# Patient Record
Sex: Male | Born: 1990 | Race: Black or African American | Hispanic: No | Marital: Single | Smoking: Never smoker
Health system: Southern US, Community
[De-identification: ages and names within clinical notes are randomized; demographics above are authoritative.]

## PROBLEM LIST (undated history)

## (undated) DIAGNOSIS — M4800 Spinal stenosis, site unspecified: Secondary | ICD-10-CM

## (undated) HISTORY — PX: NECK SURGERY: SHX720

---
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
## 2013-08-01 ENCOUNTER — Encounter (HOSPITAL_COMMUNITY): Payer: Self-pay | Admitting: Emergency Medicine

## 2013-08-01 ENCOUNTER — Emergency Department (HOSPITAL_COMMUNITY)
Admission: EM | Admit: 2013-08-01 | Discharge: 2013-08-01 | Disposition: A | Discharge status: Home / Self Care | Payer: Managed Care, Other (non HMO) | Source: Home / Self Care | Attending: Emergency Medicine | Admitting: Emergency Medicine

## 2013-08-01 DIAGNOSIS — L0291 Cutaneous abscess, unspecified: Secondary | ICD-10-CM

## 2013-08-01 DIAGNOSIS — L0390 Cellulitis, unspecified: Secondary | ICD-10-CM

## 2013-08-01 HISTORY — DX: Spinal stenosis, site unspecified: M48.00

## 2013-08-01 MED ORDER — HYDROCODONE-ACETAMINOPHEN 5-325 MG PO TABS: ORAL_TABLET | ORAL | Status: DC

## 2013-08-01 MED ORDER — SULFAMETHOXAZOLE-TMP DS 800-160 MG PO TABS: 2.0000 | ORAL_TABLET | Freq: Two times a day (BID) | ORAL | Status: DC

## 2013-08-01 NOTE — Discharge Instructions (Signed)

## 2013-08-01 NOTE — ED Provider Notes (Signed)
  Chief Complaint   Chief Complaint  Patient presents with  . Abscess    History of Present Illness   Chad Li is a 23 year old male who's had a one-week history of a painful boil on his right lower back. He denies any drainage, fever, or chills. No prior history of skin infections or MRSA.  Review of Systems   Other than as noted above, the patient denies any of the following symptoms: Systemic:  No fever, chills or sweats. Skin:  No rash or itching.  PMFSH   Past medical history, family history, social history, meds, and allergies were reviewed.   Physical Examination     Vital signs:  BP 127/84  Pulse 68  Temp(Src) 98.5 F (36.9 C) (Oral)  Resp 14  SpO2 100% Skin:  There is a 2 x 2 centimeter raised, red, erythematous, tender, fluctuant mass on the right lower back.  Skin exam was otherwise normal.  No rash. Ext:  Distal pulses were full, patient has full ROM of all joints.  Procedure   Verbal informed consent was obtained.  The patient was informed of the risks and benefits of the procedure and understands and accepts.  A time out was called and the identity of the patient and correct procedure was confirmed.   The abscess area described above was prepped with Betadine and alcohol and anesthetized with 3 mL of 2% Xylocaine with epinephrine.  Using a #11 scalpel blade, a singe straight incision was made into the area of fluctulence, yielding a small amount of prurulent drainage.  Routine cultures were obtained.  Blunt dissection was used to break up loculations and the resulting wound cavity was packed with 1/4 inch Iodoform gauze.  A sterile pressure dressing was applied.  Assessment   The encounter diagnosis was Abscess.  Plan     1.  Meds:  The following meds were prescribed:   Discharge Medication List as of 08/01/2013  2:34 PM    START taking these medications   Details  HYDROcodone-acetaminophen (NORCO/VICODIN) 5-325 MG per tablet 1 to 2 tabs every 4 to  6 hours as needed for pain., Print    sulfamethoxazole-trimethoprim (BACTRIM DS) 800-160 MG per tablet Take 2 tablets by mouth 2 (two) times daily., Starting 08/01/2013, Until Discontinued, Normal        2.  Patient Education/Counseling:  The patient was given appropriate handouts, self care instructions, and instructed in symptomatic relief.    3.  Follow up:  The patient was instructed to leave the dressing in place and return here again in 48 hours for packing removal, or sooner if becoming worse in any way, and given some red flag symptoms such as fever which would prompt immediate return.       Reuben Likesavid C Raygan Skarda, MD 08/01/13 [phone]

## 2013-08-01 NOTE — ED Notes (Signed)
Abscess to right waistline, red, painful, swollen area.  Denies any history of this before

## 2013-08-03 ENCOUNTER — Encounter (HOSPITAL_COMMUNITY): Payer: Self-pay | Admitting: Emergency Medicine

## 2013-08-03 ENCOUNTER — Emergency Department (INDEPENDENT_AMBULATORY_CARE_PROVIDER_SITE_OTHER)
Admission: EM | Admit: 2013-08-03 | Discharge: 2013-08-03 | Disposition: A | Discharge status: Home / Self Care | Payer: Managed Care, Other (non HMO) | Source: Home / Self Care

## 2013-08-03 DIAGNOSIS — Z5189 Encounter for other specified aftercare: Secondary | ICD-10-CM

## 2013-08-03 DIAGNOSIS — Z4800 Encounter for change or removal of nonsurgical wound dressing: Secondary | ICD-10-CM

## 2013-08-03 NOTE — ED Provider Notes (Signed)
Medical screening examination/treatment/procedure(s) were performed by non-physician practitioner and as supervising physician I was immediately available for consultation/collaboration.  Leslee Homeavid Sanjuana Mruk, M.D.  Reuben Likesavid C Jameon Deller, MD 08/03/13 Mikle [id number]

## 2013-08-03 NOTE — ED Provider Notes (Signed)
CSN: [id number]     Arrival date & time 08/03/13  1002 History   First MD Initiated Contact with Patient 08/03/13 1108     Chief Complaint  Patient presents with  . Wound Check   (Consider location/radiation/quality/duration/timing/severity/associated sxs/prior Treatment) HPI Comments: As above, to remove packing. There is a rounded area of induration with approx 5 ml of purulence expressed. Remains tender. St is taking his ABX. Culture report no growth on first report.  Patient is a 23 y.o. male presenting with wound check.  Wound Check    Past Medical History  Diagnosis Date  . Spinal stenosis    Past Surgical History  Procedure Laterality Date  . Neck surgery     History reviewed. No pertinent family history. History  Substance Use Topics  . Smoking status: Never Smoker   . Smokeless tobacco: Not on file  . Alcohol Use: No    Review of Systems  Constitutional: Negative for fever and chills.  Skin:       As per HPI  All other systems reviewed and are negative.    Allergies  Review of patient's allergies indicates no known allergies.  Home Medications   Current Outpatient Rx  Name  Route  Sig  Dispense  Refill  . HYDROcodone-acetaminophen (NORCO/VICODIN) 5-325 MG per tablet      1 to 2 tabs every 4 to 6 hours as needed for pain.   20 tablet   0   . sulfamethoxazole-trimethoprim (BACTRIM DS) 800-160 MG per tablet   Oral   Take 2 tablets by mouth 2 (two) times daily.   40 tablet   0    BP 113/76  Pulse 60  Temp(Src) 97.9 F (36.6 C) (Oral)  Resp 14  SpO2 100% Physical Exam  ED Course  Procedures (including critical care time) Labs Review Labs Reviewed - No data to display Imaging Review No results found.   MDM   1. Wound check, abscess    Packing removed There was about 5 ml of pus expressed. There remains an induration surrounding the incision site but no unexpected redness or lymphangitis.  Cont warm compresses and all of ABX. Return  if worse.    Hayden Rasmussenavid Kilo Eshelman, NP 08/03/13 [phone]

## 2013-08-03 NOTE — Discharge Instructions (Signed)
Continue the antibiotics and use warm compresses frequently. May shower to clean wound. If getting worse, larger, painful ,red. Return promptly.

## 2013-08-03 NOTE — ED Notes (Signed)
Packing removal, seen 3/31 for i/d.  Patient reports feeling better.  Current pain is a 4/10 with a burning sensation.  Overall , pain is much better

## 2013-08-04 LAB — CULTURE, ROUTINE-ABSCESS
Gram Stain: NONE SEEN
SPECIAL REQUESTS: NORMAL

## 2013-08-04 NOTE — Progress Notes (Signed)
Quick Note:  Results are abnormal as noted, but have been adequately treated. No further action necessary. ______ 

## 2013-08-05 ENCOUNTER — Telehealth (HOSPITAL_COMMUNITY): Payer: Self-pay | Admitting: *Deleted

## 2013-08-05 NOTE — ED Notes (Signed)
Abscess culture back: Abundant MRSA.  Pt. adequately treated with Bactrim DS.  I called pt. Pt. verified x 2 and given results.  Pt. told he was adequately treated.  [facility] the [facility] MRSA instructions with him and answered his questions. Pt. voiced understanding. Vassie MoselleYork, Eian Vandervelden M 08/05/2013

## 2013-10-03 ENCOUNTER — Emergency Department (HOSPITAL_COMMUNITY)
Admission: EM | Admit: 2013-10-03 | Discharge: 2013-10-03 | Disposition: A | Discharge status: Home / Self Care | Payer: Managed Care, Other (non HMO) | Source: Home / Self Care | Attending: Family Medicine | Admitting: Family Medicine

## 2013-10-03 ENCOUNTER — Encounter (HOSPITAL_COMMUNITY): Payer: Self-pay | Admitting: Emergency Medicine

## 2013-10-03 DIAGNOSIS — H6123 Impacted cerumen, bilateral: Secondary | ICD-10-CM

## 2013-10-03 DIAGNOSIS — H6120 Impacted cerumen, unspecified ear: Secondary | ICD-10-CM

## 2013-10-03 NOTE — ED Notes (Signed)
Pt  Reports  A  Sensation of  Fullness  To l  Ear      -  He  Has    Been  Using  q  Tips  At  Home          -  He      Is  Sitting  Upright on the  Exam table  Speaking in complete  sentances  And  Is  In no    Severe  Distress

## 2013-10-03 NOTE — ED Provider Notes (Signed)
CSN: [id number]     Arrival date & time 10/03/13  1426 History   First MD Initiated Contact with Patient 10/03/13 1522     Chief Complaint  Patient presents with  . Cerumen Impaction   (Consider location/radiation/quality/duration/timing/severity/associated sxs/prior Treatment) Patient is a 23 y.o. male presenting with plugged ear sensation. The history is provided by the patient.  Ear Fullness This is a new problem. The current episode started yesterday. The problem has been gradually worsening. Associated symptoms comments: Using q-tips and now can't hear..    Past Medical History  Diagnosis Date  . Spinal stenosis    Past Surgical History  Procedure Laterality Date  . Neck surgery     History reviewed. No pertinent family history. History  Substance Use Topics  . Smoking status: Never Smoker   . Smokeless tobacco: Not on file  . Alcohol Use: No    Review of Systems  Constitutional: Negative.   HENT: Positive for hearing loss. Negative for ear discharge and ear pain.     Allergies  Review of patient's allergies indicates no known allergies.  Home Medications   Prior to Admission medications   Medication Sig Start Date End Date Taking? Authorizing Provider  HYDROcodone-acetaminophen (NORCO/VICODIN) 5-325 MG per tablet 1 to 2 tabs every 4 to 6 hours as needed for pain. 08/01/13   Reuben Likes, MD  sulfamethoxazole-trimethoprim (BACTRIM DS) 800-160 MG per tablet Take 2 tablets by mouth 2 (two) times daily. 08/01/13   Reuben Likes, MD   BP 113/65  Pulse 56  Temp(Src) 97.4 F (36.3 C) (Oral)  Resp 12  SpO2 100% Physical Exam  Nursing note and vitals reviewed. Constitutional: He is oriented to person, place, and time. He appears well-developed and well-nourished.  HENT:  Cerumen impaction bilat.  Neck: Normal range of motion. Neck supple.  Lymphadenopathy:    He has no cervical adenopathy.  Neurological: He is alert and oriented to person, place, and time.   Skin: Skin is warm and dry.    ED Course  EAR CERUMEN REMOVAL Date/Time: 10/03/2013 3:29 PM Performed by: Linna Hoff Authorized by: Bradd Canary D Consent: Verbal consent obtained. Risks and benefits: risks, benefits and alternatives were discussed Consent given by: patient Local anesthetic: none Location details: right ear Procedure type: irrigation Patient sedated: no Patient tolerance: Patient tolerated the procedure well with no immediate complications.   (including critical care time) Labs Review Labs Reviewed - No data to display  Imaging Review No results found.   MDM   1. Impacted cerumen of both ears    Canals and tm nl bilat after irrig.    Linna Hoff, MD 10/05/13 [phone]

## 2013-12-22 ENCOUNTER — Emergency Department (INDEPENDENT_AMBULATORY_CARE_PROVIDER_SITE_OTHER)
Admission: EM | Admit: 2013-12-22 | Discharge: 2013-12-22 | Disposition: A | Discharge status: Home / Self Care | Payer: Managed Care, Other (non HMO) | Source: Home / Self Care | Attending: Family Medicine | Admitting: Family Medicine

## 2013-12-22 ENCOUNTER — Encounter (HOSPITAL_COMMUNITY): Payer: Self-pay | Admitting: Emergency Medicine

## 2013-12-22 DIAGNOSIS — B2790 Infectious mononucleosis, unspecified without complication: Secondary | ICD-10-CM

## 2013-12-22 LAB — POCT RAPID STREP A: STREPTOCOCCUS, GROUP A SCREEN (DIRECT): NEGATIVE

## 2013-12-22 LAB — POCT INFECTIOUS MONO SCREEN: Mono Screen: POSITIVE — AB

## 2013-12-22 NOTE — ED Notes (Signed)
C/o sore throat-8/17

## 2013-12-22 NOTE — Discharge Instructions (Signed)
Thank you for coming in today. Take up to 2 aleve twice daily.  Come back as needed   Infectious Mononucleosis Infectious mononucleosis (mono) is a common germ (viral) infection in children, teenagers, and young adults.  CAUSES  Mono is an infection caused by the Malachi CarlEpstein Barr virus. The virus is spread by close personal contact with someone who has the infection. It can be passed by contact with your saliva through things such as kissing or sharing drinking glasses. Sometimes, the infection can be spread from someone who does not appear sick but still spreads the virus (asymptomatic carrier state).  SYMPTOMS  The most common symptoms of Mono are:  Sore throat.  Headache.  Fatigue.  Muscle aches.  Swollen glands.  Fever.  Poor appetite.  Enlarged liver or spleen. The less common symptoms can include:  Rash.  Feeling sick to your stomach (nauseous).  Abdominal pain. DIAGNOSIS  Mono is diagnosed by a blood test.  TREATMENT  Treatment of mono is usually at home. There is no medicine that cures this virus. Sometimes hospital treatment is needed in severe cases. Steroid medicine sometimes is needed if the swelling in the throat causes breathing or swallowing problems.  HOME CARE INSTRUCTIONS   Drink enough fluids to keep your urine clear or pale yellow.  Eat soft foods. Cool foods like popsicles or ice cream can soothe a sore throat.  Only take over-the-counter or prescription medicines for pain, discomfort, or fever as directed by your caregiver. Children under 21 years of age should not take aspirin.  Gargle salt water. This may help relieve your sore throat. Put 1 teaspoon (tsp) of salt in 1 cup of warm water. Sucking on hard candy may also help.  Rest as needed.  Start regular activities gradually after the fever is gone. Be sure to rest when tired.  Avoid strenuous exercise or contact sports until your caregiver says it is okay. The liver and spleen could be  seriously injured.  Avoid sharing drinking glasses or kissing until your caregiver tells you that you are no longer contagious. SEEK MEDICAL CARE IF:   Your fever is not gone after 7 days.  Your activity level is not back to normal after 2 weeks.  You have yellow coloring to eyes and skin (jaundice). SEEK IMMEDIATE MEDICAL CARE IF:   You have severe pain in the abdomen or shoulder.  You have trouble swallowing or drooling.  You have trouble breathing.  You develop a stiff neck.  You develop a severe headache.  You cannot stop throwing up (vomiting).  You have convulsions.  You are confused.  You have trouble with balance.  You develop signs of body fluid loss (dehydration):  Weakness.  Sunken eyes.  Pale skin.  Dry mouth.  Rapid breathing or pulse. MAKE SURE YOU:   Understand these instructions.  Will watch your condition.  Will get help right away if you are not doing well or get worse. Document Released: 04/17/2000 Document Revised: 07/13/2011 Document Reviewed: 02/14/2008 [facility] Patient Information 2015 [place], [place]. This information is not intended to replace advice given to you by your health care provider. Make sure you discuss any questions you have with your health care provider.

## 2013-12-22 NOTE — ED Provider Notes (Signed)
Chad Li is a 23 y.o. male who presents to Urgent Care today for sore throat. Patient has a 2 to three-day history of sore throat. His wife and recently tested positive for mono. Fevers or chills nausea vomiting or diarrhea. Pain is mild. Patient has not tried any medications yet.   Past Medical History  Diagnosis Date  . Spinal stenosis    History  Substance Use Topics  . Smoking status: Never Smoker   . Smokeless tobacco: Not on file  . Alcohol Use: No   ROS as above Medications: No current facility-administered medications for this encounter.   Current Outpatient Prescriptions  Medication Sig Dispense Refill  . AMOXICILLIN PO Take by mouth.        Exam:  BP 131/59  Pulse 61  Temp(Src) 98.8 F (37.1 C) (Oral)  Resp 12  SpO2 100% Gen: Well NAD HEENT: EOMI,  MMM posterior pharynx is erythematous with exudate. Normal tympanic membranes bilaterally Lungs: Normal work of breathing. CTABL Heart: RRR no MRG Abd: NABS, Soft. Nondistended, Nontender Exts: Brisk capillary refill, warm and well perfused.   Results for orders placed during the hospital encounter of 12/22/13 (from the past 24 hour(Li))  POCT RAPID STREP A ([facility] URG CARE ONLY)     Status: None   Collection Time    12/22/13  7:36 PM      Result Value Ref Range   Streptococcus, Group A Screen (Direct) NEGATIVE  NEGATIVE  POCT INFECTIOUS MONO SCREEN     Status: Abnormal   Collection Time    12/22/13  7:44 PM      Result Value Ref Range   Mono Screen POSITIVE (*) NEGATIVE   No results found.  Assessment and Plan: 23 y.o. male with Mono. The Monospot test was somewhat equivocal. Most likely explanation. Her culture pending. Symptomatic measures with Tylenol or ibuprofen  Discussed warning signs or symptoms. Please see discharge instructions. Patient expresses understanding.   This note was created using Conservation officer, historic buildingsDragon voice recognition software. Any transcription errors are unintended.    Chad BongEvan Li Chad Bentley,  MD 12/22/13 2120

## 2013-12-24 LAB — CULTURE, GROUP A STREP

## 2014-02-15 ENCOUNTER — Ambulatory Visit (INDEPENDENT_AMBULATORY_CARE_PROVIDER_SITE_OTHER): Payer: 59 | Admitting: Family Medicine

## 2014-02-15 VITALS — BP 118/66 | HR 72 | Temp 98.8°F | Resp 16 | Ht 72.52 in | Wt 133.6 lb

## 2014-02-15 DIAGNOSIS — Z8614 Personal history of Methicillin resistant Staphylococcus aureus infection: Secondary | ICD-10-CM

## 2014-02-15 DIAGNOSIS — L02214 Cutaneous abscess of groin: Secondary | ICD-10-CM

## 2014-02-15 MED ORDER — SULFAMETHOXAZOLE-TMP DS 800-160 MG PO TABS: 1.0000 | ORAL_TABLET | Freq: Two times a day (BID) | ORAL | Status: DC

## 2014-02-15 NOTE — Patient Instructions (Signed)
Warm compresses at least 4-5 times per day, take antibiotic twice per day.  Avoid close shaving of this area in future. If bumps in folds of leg not improving once the skin infection improves - return for other testing and evaluation.   Abscess An abscess is an infected area that contains a collection of pus and debris.It can occur in almost any part of the body. An abscess is also known as a furuncle or boil. CAUSES  An abscess occurs when tissue gets infected. This can occur from blockage of oil or sweat glands, infection of hair follicles, or a minor injury to the skin. As the body tries to fight the infection, pus collects in the area and creates pressure under the skin. This pressure causes pain. People with weakened immune systems have difficulty fighting infections and get certain abscesses more often.  SYMPTOMS Usually an abscess develops on the skin and becomes a painful mass that is red, warm, and tender. If the abscess forms under the skin, you may feel a moveable soft area under the skin. Some abscesses break open (rupture) on their own, but most will continue to get worse without care. The infection can spread deeper into the body and eventually into the bloodstream, causing you to feel ill.  DIAGNOSIS  Your caregiver will take your medical history and perform a physical exam. A sample of fluid may also be taken from the abscess to determine what is causing your infection. TREATMENT  Your caregiver may prescribe antibiotic medicines to fight the infection. However, taking antibiotics alone usually does not cure an abscess. Your caregiver may need to make a small cut (incision) in the abscess to drain the pus. In some cases, gauze is packed into the abscess to reduce pain and to continue draining the area. HOME CARE INSTRUCTIONS   Only take over-the-counter or prescription medicines for pain, discomfort, or fever as directed by your caregiver.  If you were prescribed antibiotics, take  them as directed. Finish them even if you start to feel better.  If gauze is used, follow your caregiver's directions for changing the gauze.  To avoid spreading the infection:  Keep your draining abscess covered with a bandage.  Wash your hands well.  Do not share personal care items, towels, or whirlpools with others.  Avoid skin contact with others.  Keep your skin and clothes clean around the abscess.  Keep all follow-up appointments as directed by your caregiver. SEEK MEDICAL CARE IF:   You have increased pain, swelling, redness, fluid drainage, or bleeding.  You have muscle aches, chills, or a general ill feeling.  You have a fever. MAKE SURE YOU:   Understand these instructions.  Will watch your condition.  Will get help right away if you are not doing well or get worse. Document Released: 01/28/2005 Document Revised: 10/20/2011 Document Reviewed: 07/03/2011 [facility] Patient Information 2015 [place], [place]. This information is not intended to replace advice given to you by your health care provider. Make sure you discuss any questions you have with your health care provider.

## 2014-02-15 NOTE — Progress Notes (Signed)
Procedure Note Consent obtained. Cleaned abscess with alcohol. Local anesthesia 1% lido w/ epi. Cleaned with iodine. 1 in incision made along abscess. Culture obtained. Pus expressed. Wound explored and packed. Dressing placed.   Janan Ridgeishira Nana Hoselton, PA-C

## 2014-02-15 NOTE — Progress Notes (Signed)
Subjective:  This chart was scribed for Chad StaggersJeffrey Aleksa Catterton, MD by Chad Li, Medical Scribe. This patient was seen in Room 11 and the patient's care was started at 4:07 PM.   Patient ID: Chad Li, male    DOB: 06-Oct-1990, 23 y.o.   MRN: [id number]  HPI HPI Comments: Chad Li is a 23 y.o. male who presents to the [facility] complaining of a gradually worsening abscess in the right suprapubic are of his abdomen for 1 week. Pt noticed it after shaving and thought initially it was a little hair bump. He has noticed some drainage from the abscess that began yesterday.  Has not taken any medication. He has applied hot compresses intermittently 2x a day for 2-3 days. He denies any new sexual partners. He has had around 10 lifetime sexual partners. He was last treated for an STI 1 year ago. He has not had any new sexual partners since the last test. He denies any fever, chills, nausea, vomiting, diarrhea, penile discharge, testicular pain.   On chart review he was treated in March 2015 with a right lower back abscess with Septra DS. Wound culture positive for MRSA at that time sensitive to Septra and tetracycline  PCP: No PCP Per Patient  There are no active problems to display for this patient.  Past Medical History  Diagnosis Date  . Spinal stenosis    Past Surgical History  Procedure Laterality Date  . Neck surgery     No Known Allergies Prior to Admission medications   Not on File   History   Social History  . Marital Status: Single    Spouse Name: N/A    Number of Children: N/A  . Years of Education: N/A   Occupational History  . Not on file.   Social History Main Topics  . Smoking status: Never Smoker   . Smokeless tobacco: Not on file  . Alcohol Use: No  . Drug Use: No  . Sexual Activity: Not on file   Other Topics Concern  . Not on file   Social History Narrative  . No narrative on file     Review of Systems  Constitutional:  Negative for fever and chills.  Gastrointestinal: Negative for nausea, vomiting and diarrhea.  Genitourinary: Negative for discharge, penile pain and testicular pain.  Skin: Positive for color change (around abscess).       + Abscess to right suprapubic area of abdomen  All other systems reviewed and are negative.      Objective:   Physical Exam  Nursing note and vitals reviewed. Constitutional: He is oriented to person, place, and time. He appears well-developed and well-nourished. No distress.  HENT:  Head: Normocephalic and atraumatic.  Eyes: Conjunctivae and EOM are normal.  Neck: Neck supple. No tracheal deviation present.  Cardiovascular: Normal rate.   Pulmonary/Chest: Effort normal. No respiratory distress.  Genitourinary: Testes normal and penis normal. Right testis shows no tenderness. Left testis shows no tenderness. No discharge found.  Musculoskeletal: Normal range of motion.  Lymphadenopathy:       Right: Inguinal (non tender) adenopathy present.       Left: Inguinal (non tender) adenopathy present.  Neurological: He is alert and oriented to person, place, and time.  Skin: Skin is warm and dry.  6 cm by 3 cm of erythema on the right lower abdominal wall just above inguinal crease with central induration and fluctuance over middle most aspect with overlying pustule. He  does have some enlarged right and left inguinal lymph nodes   Psychiatric: He has a normal mood and affect. His behavior is normal.      Filed Vitals:   02/15/14 1546  BP: 118/66  Pulse: 72  Temp: 98.8 F (37.1 C)  TempSrc: Oral  Resp: 16  Height: 6' 0.52" (1.842 m)  Weight: 133 lb 9.6 oz (60.601 kg)  SpO2: 100%       Assessment & Plan:   Chad Li is a 23 y.o. male Abscess of right groin - Plan: Wound culture, sulfamethoxazole-trimethoprim (BACTRIM DS) 800-160 MG per tablet  History of MRSA infection - Plan: Wound culture, sulfamethoxazole-trimethoprim (BACTRIM DS) 800-160 MG per  tablet  I and D as in other note. Start Septra as tolerated prior. rtc precautions and aftercare discussed. Suspected reactive LAD in groin, but RTC if not resolving with treatment of abscess. All questions answered.   Meds ordered this encounter  Medications  . sulfamethoxazole-trimethoprim (BACTRIM DS) 800-160 MG per tablet    Sig: Take 1 tablet by mouth 2 (two) times daily.    Dispense:  20 tablet    Refill:  0   Patient Instructions  Warm compresses at least 4-5 times per day, take antibiotic twice per day.  Avoid close shaving of this area in future. If bumps in folds of leg not improving once the skin infection improves - return for other testing and evaluation.   Abscess An abscess is an infected area that contains a collection of pus and debris.It can occur in almost any part of the body. An abscess is also known as a furuncle or boil. CAUSES  An abscess occurs when tissue gets infected. This can occur from blockage of oil or sweat glands, infection of hair follicles, or a minor injury to the skin. As the body tries to fight the infection, pus collects in the area and creates pressure under the skin. This pressure causes pain. People with weakened immune systems have difficulty fighting infections and get certain abscesses more often.  SYMPTOMS Usually an abscess develops on the skin and becomes a painful mass that is red, warm, and tender. If the abscess forms under the skin, you may feel a moveable soft area under the skin. Some abscesses break open (rupture) on their own, but most will continue to get worse without care. The infection can spread deeper into the body and eventually into the bloodstream, causing you to feel ill.  DIAGNOSIS  Your caregiver will take your medical history and perform a physical exam. A sample of fluid may also be taken from the abscess to determine what is causing your infection. TREATMENT  Your caregiver may prescribe antibiotic medicines to fight the  infection. However, taking antibiotics alone usually does not cure an abscess. Your caregiver may need to make a small cut (incision) in the abscess to drain the pus. In some cases, gauze is packed into the abscess to reduce pain and to continue draining the area. HOME CARE INSTRUCTIONS   Only take over-the-counter or prescription medicines for pain, discomfort, or fever as directed by your caregiver.  If you were prescribed antibiotics, take them as directed. Finish them even if you start to feel better.  If gauze is used, follow your caregiver's directions for changing the gauze.  To avoid spreading the infection:  Keep your draining abscess covered with a bandage.  Wash your hands well.  Do not share personal care items, towels, or whirlpools with others.  Avoid  skin contact with others.  Keep your skin and clothes clean around the abscess.  Keep all follow-up appointments as directed by your caregiver. SEEK MEDICAL CARE IF:   You have increased pain, swelling, redness, fluid drainage, or bleeding.  You have muscle aches, chills, or a general ill feeling.  You have a fever. MAKE SURE YOU:   Understand these instructions.  Will watch your condition.  Will get help right away if you are not doing well or get worse. Document Released: 01/28/2005 Document Revised: 10/20/2011 Document Reviewed: 07/03/2011 [facility] Patient Information 2015 [place], [place]. This information is not intended to replace advice given to you by your health care provider. Make sure you discuss any questions you have with your health care provider.

## 2014-02-18 LAB — WOUND CULTURE: Gram Stain: NONE SEEN

## 2014-02-19 ENCOUNTER — Ambulatory Visit (INDEPENDENT_AMBULATORY_CARE_PROVIDER_SITE_OTHER): Payer: 59 | Admitting: Physician Assistant

## 2014-02-19 VITALS — BP 120/76 | HR 64 | Temp 98.4°F | Resp 16 | Ht 72.5 in | Wt 135.6 lb

## 2014-02-19 DIAGNOSIS — Z5189 Encounter for other specified aftercare: Secondary | ICD-10-CM

## 2014-02-19 DIAGNOSIS — L02214 Cutaneous abscess of groin: Secondary | ICD-10-CM

## 2014-02-19 NOTE — Patient Instructions (Signed)
The incision is looking much better.  Continue to take your antibiotics as prescribed. The cultures from last visit came back and the antibiotic works great for that.  Continue to apply the warm compresses a few times per day.  We did not need to repack the wound today since it appears to be improving, is not draining anything, and is not causing you any pain. If you notice it starts to get bigger, cause you pain, or discharge starts to come out of it again, please come back to be seen.

## 2014-02-19 NOTE — Progress Notes (Signed)
I have discussed this patient with Mr. Agustin CreeMcVeigh, Cordelia Poche-C and agree.

## 2014-02-19 NOTE — Progress Notes (Signed)
   Subjective:    Patient ID: Chad Li, male    DOB: [birth year], 23 y.o.   MRN: [id number]  No PCP Per Patient  Chief Complaint  Patient presents with  . Wound Check   There are no active problems to display for this patient.  Prior to Admission medications   Medication Sig Start Date End Date Taking? Authorizing Provider  sulfamethoxazole-trimethoprim (BACTRIM DS) 800-160 MG per tablet Take 1 tablet by mouth 2 (two) times daily. 02/15/14  Yes Shade FloodJeffrey R Greene, MD   Medications, allergies, past medical history, surgical history, family history, social history and problem list reviewed and updated.   Wound Check   23 yom returns for wound care visit after having right groin/lower abdomen abscess I&D on 02/15/14. Cultures came back positive for MRSA. He has been taking his bactrim as prescribed. Been applying warm compresses once daily. When he was here initially on 10/15 the wound was packed. Unfortunately the packing fell out the next day on 10/16 when he was changing his dressing.  He has not been having any more pain or discomfort at the site for the past couple days. He has not seen any drainage for the past day with his dressing changes.   Review of Systems No fever, chills, N/V, diarrhea, no penile discharge.     Objective:   Physical Exam  Constitutional: He is oriented to person, place, and time.  BP 120/76  Pulse 64  Temp(Src) 98.4 F (36.9 C) (Oral)  Resp 16  Ht 6' 0.5" (1.842 m)  Wt 135 lb 9.6 oz (61.508 kg)  BMI 18.13 kg/m2  SpO2 99%   Lymphadenopathy:       Right: No inguinal adenopathy present.       Left: No inguinal adenopathy present.  Neurological: He is alert and oriented to person, place, and time.  Skin:  4cm x 2cm erythematous area right lower abdomen, just above inguinal canal. Entire 4cm x 2cm area is indurated. No fluctuance. 0.5" incision at center of area is present, the tissue edges have reconnected and there is no active drainage with  expression. Small amount of serous drainage on dressing upon removal.       Assessment & Plan:   23 yom with history of prior MRSA abscess returns for wound care.   Abscess of right groin  Encounter for wound care  --Wound is healing appropriately. Erythema, induration, and drainage have all improved. --No packing placed today as wound has closed up since packing fell out 3 days ago.  --Culture came back as MRSA susceptible to the bactrim he is on, continue ABI. --Pt encouraged to RTC if area becomes painful, seems to worsen, or if drainage starts to come out of incision again.   Donnajean Lopesodd M. Zylon Creamer, PA-C Physician Assistant-Certified [facility]  02/19/2014 2:00 PM

## 2016-04-01 ENCOUNTER — Ambulatory Visit (HOSPITAL_COMMUNITY)
Admission: EM | Admit: 2016-04-01 | Discharge: 2016-04-01 | Disposition: A | Discharge status: Home / Self Care | Payer: Self-pay | Attending: Internal Medicine | Admitting: Internal Medicine

## 2016-04-01 ENCOUNTER — Encounter (HOSPITAL_COMMUNITY): Payer: Self-pay | Admitting: Emergency Medicine

## 2016-04-01 DIAGNOSIS — R300 Dysuria: Secondary | ICD-10-CM | POA: Insufficient documentation

## 2016-04-01 DIAGNOSIS — N342 Other urethritis: Secondary | ICD-10-CM | POA: Insufficient documentation

## 2016-04-01 DIAGNOSIS — Z79899 Other long term (current) drug therapy: Secondary | ICD-10-CM | POA: Insufficient documentation

## 2016-04-01 DIAGNOSIS — Z9889 Other specified postprocedural states: Secondary | ICD-10-CM | POA: Insufficient documentation

## 2016-04-01 DIAGNOSIS — M4800 Spinal stenosis, site unspecified: Secondary | ICD-10-CM | POA: Insufficient documentation

## 2016-04-01 LAB — POCT URINALYSIS DIP (DEVICE)
Bilirubin Urine: NEGATIVE
GLUCOSE, UA: NEGATIVE mg/dL
Hgb urine dipstick: NEGATIVE
KETONES UR: NEGATIVE mg/dL
LEUKOCYTES UA: NEGATIVE
Nitrite: NEGATIVE
Protein, ur: 30 mg/dL — AB
UROBILINOGEN UA: 1 mg/dL (ref 0.0–1.0)
pH: 7 (ref 5.0–8.0)

## 2016-04-01 MED ORDER — STERILE WATER FOR INJECTION IJ SOLN
INTRAMUSCULAR | Status: AC
  Filled 2016-04-01: qty 10

## 2016-04-01 MED ORDER — AZITHROMYCIN 250 MG PO TABS
1000.0000 mg | ORAL_TABLET | Freq: Once | ORAL | Status: AC
  Administered 2016-04-01: 1000 mg via ORAL

## 2016-04-01 MED ORDER — AZITHROMYCIN 250 MG PO TABS
ORAL_TABLET | ORAL | Status: AC
  Filled 2016-04-01: qty 4

## 2016-04-01 MED ORDER — CEFTRIAXONE SODIUM 250 MG IJ SOLR
250.0000 mg | Freq: Once | INTRAMUSCULAR | Status: AC
  Administered 2016-04-01: 250 mg via INTRAMUSCULAR

## 2016-04-01 MED ORDER — CEFTRIAXONE SODIUM 250 MG IJ SOLR
INTRAMUSCULAR | Status: AC
  Filled 2016-04-01: qty 250

## 2016-04-01 MED ORDER — PHENAZOPYRIDINE HCL 200 MG PO TABS: 200.0000 mg | ORAL_TABLET | Freq: Three times a day (TID) | ORAL | 0 refills | Status: DC

## 2016-04-01 NOTE — ED Triage Notes (Signed)
[facility] a [facility] dysuria x 4 days. The patient denied any abdominal or back pain or penile discharge.

## 2016-04-01 NOTE — ED Provider Notes (Signed)
CSN: [id number]     Arrival date & time 04/01/16  1121 History   First MD Initiated Contact with Patient 04/01/16 1312     Chief Complaint  Patient presents with  . Dysuria   (Consider location/radiation/quality/duration/timing/severity/associated sxs/prior Treatment) Patient has been having some dysuria and penile discomfort for 4 days.  He has had unprotected sex.   The history is provided by the patient.  Dysuria  This is a new problem. The current episode started more than 2 days ago. The problem occurs constantly. The problem has not changed since onset.The symptoms are aggravated by intercourse. Nothing relieves the symptoms. He has tried nothing for the symptoms.    Past Medical History:  Diagnosis Date  . Spinal stenosis    Past Surgical History:  Procedure Laterality Date  . NECK SURGERY     History reviewed. No pertinent family history. Social History  Substance Use Topics  . Smoking status: Never Smoker  . Smokeless tobacco: Not on file  . Alcohol use No    Review of Systems  Constitutional: Negative.   HENT: Negative.   Eyes: Negative.   Respiratory: Negative.   Cardiovascular: Negative.   Gastrointestinal: Negative.   Endocrine: Negative.   Genitourinary: Positive for dysuria.  Musculoskeletal: Negative.   Skin: Negative.   Allergic/Immunologic: Negative.   Neurological: Negative.   Hematological: Negative.   Psychiatric/Behavioral: Negative.     Allergies  Patient has no known allergies.  Home Medications   Prior to Admission medications   Medication Sig Start Date End Date Taking? Authorizing Provider  sulfamethoxazole-trimethoprim (BACTRIM DS) 800-160 MG per tablet Take 1 tablet by mouth 2 (two) times daily. 02/15/14   Shade FloodJeffrey R Greene, MD   Meds Ordered and Administered this Visit  Medications - No data to display  BP (!) 124/54 (BP Location: Left Arm)   Pulse 64   Temp 98.6 F (37 C) (Oral)   Resp 16   SpO2 99%  No data  found.   Physical Exam  Constitutional: He is oriented to person, place, and time. He appears well-developed and well-nourished.  HENT:  Head: Normocephalic and atraumatic.  Eyes: Conjunctivae and EOM are normal. Pupils are equal, round, and reactive to light.  Neck: Normal range of motion. Neck supple.  Cardiovascular: Normal rate, regular rhythm and normal heart sounds.   Pulmonary/Chest: Effort normal and breath sounds normal.  Genitourinary: Penile tenderness present.  Genitourinary Comments: No urethral Discharge.  Urethral tenderness. No testicular tenderness. No inguinal hernia bilateral.  Neurological: He is alert and oriented to person, place, and time.  Nursing note and vitals reviewed.   [facility]gent [facility] Course   Clinical Course     Procedures (including critical care time)  Labs Review Labs Reviewed  POCT URINALYSIS DIP (DEVICE) - Abnormal; Notable for the following:       Result Value   Protein, ur 30 (*)    All other components within normal limits    Imaging Review No results found.   Visual Acuity Review  Right Eye Distance:   Left Eye Distance:   Bilateral Distance:    Right Eye Near:   Left Eye Near:    Bilateral Near:         MDM   Urethritis - Rocephin 250mg  IM Zithromax 250mg  x 4  Pyridium 200mg  one po tid x 3 days #9 UA wnl UA cytology GC chlamydia, trich     Deatra CanterWilliam J Oxford, FNP 04/01/16 [phone]

## 2016-04-01 NOTE — ED Notes (Signed)
Dirty and Clean urine collected.

## 2016-04-02 LAB — URINE CYTOLOGY ANCILLARY ONLY
Chlamydia: NEGATIVE
Neisseria Gonorrhea: NEGATIVE
Trichomonas: NEGATIVE

## 2017-05-06 ENCOUNTER — Other Ambulatory Visit: Payer: Self-pay

## 2017-05-06 ENCOUNTER — Emergency Department (HOSPITAL_BASED_OUTPATIENT_CLINIC_OR_DEPARTMENT_OTHER)
Admission: EM | Admit: 2017-05-06 | Discharge: 2017-05-06 | Disposition: A | Discharge status: Home / Self Care | Payer: BLUE CROSS/BLUE SHIELD | Attending: Emergency Medicine | Admitting: Emergency Medicine

## 2017-05-06 ENCOUNTER — Encounter (HOSPITAL_BASED_OUTPATIENT_CLINIC_OR_DEPARTMENT_OTHER): Payer: Self-pay

## 2017-05-06 ENCOUNTER — Emergency Department (HOSPITAL_BASED_OUTPATIENT_CLINIC_OR_DEPARTMENT_OTHER): Payer: BLUE CROSS/BLUE SHIELD

## 2017-05-06 DIAGNOSIS — N50811 Right testicular pain: Secondary | ICD-10-CM | POA: Insufficient documentation

## 2017-05-06 DIAGNOSIS — N50819 Testicular pain, unspecified: Secondary | ICD-10-CM

## 2017-05-06 DIAGNOSIS — N503 Cyst of epididymis: Secondary | ICD-10-CM | POA: Insufficient documentation

## 2017-05-06 LAB — URINALYSIS, ROUTINE W REFLEX MICROSCOPIC
Bilirubin Urine: NEGATIVE
Glucose, UA: NEGATIVE mg/dL
Hgb urine dipstick: NEGATIVE
Ketones, ur: NEGATIVE mg/dL
LEUKOCYTES UA: NEGATIVE
Nitrite: NEGATIVE
PROTEIN: NEGATIVE mg/dL
Specific Gravity, Urine: 1.02 (ref 1.005–1.030)
pH: 8 (ref 5.0–8.0)

## 2017-05-06 NOTE — ED Triage Notes (Signed)
C/o pain to right testicle x today-denies injury-NAD-steady gait

## 2017-05-06 NOTE — ED Provider Notes (Signed)
[facility] EMERGENCY DEPARTMENT Provider Note   CSN: [id number] Arrival date & time: 05/06/17  1201     History   Chief Complaint Chief Complaint  Patient presents with  . Testicle Pain    HPI Ricki TAYSHAUN KROH is a 27 y.o. male.  Patient presents with acute onset of right-sided testicular pain at approximately 10:45 AM.  Patient has had similar pain in the past, not associated with any swelling.  Area hurts worse with movement and walking and presents today because he was unable to work due to the pain.  Denies any injuries.  He denies any penile discharge or urinary symptoms.  Is sexually active but not in a month and a half.  No treatments prior to arrival.  The course is constant. Alleviating factors: none.        Past Medical History:  Diagnosis Date  . Spinal stenosis     There are no active problems to display for this patient.   Past Surgical History:  Procedure Laterality Date  . NECK SURGERY         Home Medications    Prior to Admission medications   Not on File    Family History No family history on file.  Social History Social History   Tobacco Use  . Smoking status: Never Smoker  . Smokeless tobacco: Never Used  Substance Use Topics  . Alcohol use: Yes    Comment: occ  . Drug use: No     Allergies   Patient has no known allergies.   Review of Systems Review of Systems  Constitutional: Negative for fever.  HENT: Negative for rhinorrhea and sore throat.   Eyes: Negative for redness.  Respiratory: Negative for cough.   Cardiovascular: Negative for chest pain.  Gastrointestinal: Negative for abdominal pain, diarrhea, nausea and vomiting.  Genitourinary: Positive for testicular pain. Negative for discharge, dysuria, frequency and scrotal swelling.  Musculoskeletal: Negative for myalgias.  Skin: Negative for rash.  Neurological: Negative for headaches.     Physical Exam Updated Vital Signs BP (!) 122/104 (BP  Location: Left Arm)   Pulse 63   Temp 97.6 F (36.4 C) (Oral)   Resp 18   Ht 6' (1.829 m)   Wt 63.5 kg (140 lb)   SpO2 100%   BMI 18.99 kg/m   Physical Exam  Constitutional: He appears well-developed and well-nourished.  HENT:  Head: Normocephalic and atraumatic.  Eyes: Conjunctivae are normal. Right eye exhibits no discharge. Left eye exhibits no discharge.  Neck: Normal range of motion. Neck supple.  Cardiovascular: Normal rate, regular rhythm and normal heart sounds.  Pulmonary/Chest: Effort normal and breath sounds normal.  Abdominal: Soft. There is no tenderness.  Genitourinary: Penis normal. Right testis shows tenderness. Right testis shows no mass and no swelling. Left testis shows no mass, no swelling and no tenderness. No discharge found.  Lymphadenopathy: No inguinal adenopathy noted on the right or left side.  Neurological: He is alert.  Skin: Skin is warm and dry.  Psychiatric: He has a normal mood and affect.  Nursing note and vitals reviewed.    ED Treatments / Results  Labs (all labs ordered are listed, but only abnormal results are displayed) Labs Reviewed  URINALYSIS, ROUTINE W REFLEX MICROSCOPIC    EKG  EKG Interpretation None       Radiology US Scrotum  Result Date: 05/06/2017 CLINICAL DATA:  Onset of right-sided scrotal or testicular discomfort today. The patient reports many episodes of scrotal pain  over the years but is unsure of the laterality. EXAM: SCROTAL ULTRASOUND DOPPLER ULTRASOUND OF THE TESTICLES TECHNIQUE: Complete ultrasound examination of the testicles, epididymis, and other scrotal structures was performed. Color and spectral Doppler ultrasound were also utilized to evaluate blood flow to the testicles. COMPARISON:  None in PACs FINDINGS: Right testicle 4.7 x 2.4 x 3.1 cm.  No mass or microlithiasis visualized. Separate from the right epididymis and testicle is a 2.1 x 1.0 x 2.3 cm cystic structure. Left testicle Measurements: 4.2 x 2.1  x 3.1 cm. No mass or microlithiasis visualized. Right epididymis:  Normal in size and appearance. Left epididymis:  Normal in size and appearance. Hydrocele:  None visualized. Varicocele:  Small left-sided hydrocele. IMPRESSION: Simple appearing cyst in the superior aspect of the scrotum to the right of midline which appears to be separate from the epididymis and right testicle. It is not hypervascular and may reflect a sperm granuloma or atypical epididymal cyst. No evidence of testicular masses,torsion, or orchitis. No evidence of acute epididymitis. Electronically Signed   By: David  Swazil[place]Jordan M.D.   On: 05/06/2017 14:13   [place] Pelvic Doppler (torsion R/o Or Mass Arterial Flow)  Result Date: 05/06/2017 CLINICAL DATA:  Onset of right-sided scrotal or testicular discomfort today. The patient reports many episodes of scrotal pain over the years but is unsure of the laterality. EXAM: SCROTAL ULTRASOUND DOPPLER ULTRASOUND OF THE TESTICLES TECHNIQUE: Complete ultrasound examination of the testicles, epididymis, and other scrotal structures was performed. Color and spectral Doppler ultrasound were also utilized to evaluate blood flow to the testicles. COMPARISON:  None in PACs FINDINGS: Right testicle 4.7 x 2.4 x 3.1 cm.  No mass or microlithiasis visualized. Separate from the right epididymis and testicle is a 2.1 x 1.0 x 2.3 cm cystic structure. Left testicle Measurements: 4.2 x 2.1 x 3.1 cm. No mass or microlithiasis visualized. Right epididymis:  Normal in size and appearance. Left epididymis:  Normal in size and appearance. Hydrocele:  None visualized. Varicocele:  Small left-sided hydrocele. IMPRESSION: Simple appearing cyst in the superior aspect of the scrotum to the right of midline which appears to be separate from the epididymis and right testicle. It is not hypervascular and may reflect a sperm granuloma or atypical epididymal cyst. No evidence of testicular masses,torsion, or orchitis. No evidence of acute  epididymitis. Electronically Signed   By: David  [place] M.D.   On: 05/06/2017 14:13    Procedures Procedures (including critical care time)  Medications Ordered in ED Medications - No data to display   Initial Impression / Assessment and Plan / ED Course  I have reviewed the triage vital signs and the nursing notes.  Pertinent labs & imaging results that were available during my care of the patient were reviewed by me and considered in my medical decision making (see chart for details).     Patient seen and examined.  Low clinical suspicion for testicular torsion.  Urine clear.  Ultrasound pending.    Vital signs reviewed and are as follows: BP (!) 122/104 (BP Location: Left Arm)   Pulse 63   Temp 97.6 F (36.4 C) (Oral)   Resp 18   Ht 6' (1.829 m)   Wt 63.5 kg (140 lb)   SpO2 100%   BMI 18.99 kg/m   2:58 PM patient updated on ultrasound results.  Will give referral to urology for further management.  Otherwise encouraged Tylenol, Motrin, support for symptom control.  Work note for tomorrow.  Patient encouraged to return  with any worsening symptoms.  Final Clinical Impressions(s) / ED Diagnoses   Final diagnoses:  Testicle pain  Epididymal cyst   Patient with right testicular pain, recurrent, with simple appearing cyst on ultrasound.  Urology follow-up and symptom control as above.  Negative for torsion.  ED Discharge Orders    None       Renne Crigler, PA-C 05/06/17 1500    Cathren Laine, MD 05/06/17 [phone]

## 2017-05-06 NOTE — ED Notes (Signed)
Pt just returned from US

## 2017-05-06 NOTE — Discharge Instructions (Signed)
Please read and follow all provided instructions.  Your diagnoses today include:  1. Epididymal cyst   2. Testicle pain     Tests performed today include:  Urine test -no infection  Ultrasound - shows a cyst on the right side  Vital signs. See below for your results today.   Medications prescribed:   None  Take any prescribed medications only as directed.  Home care instructions:  Follow any educational materials contained in this packet.  BE VERY CAREFUL not to take multiple medicines containing Tylenol (also called acetaminophen). Doing so can lead to an overdose which can damage your liver and cause liver failure and possibly death.   Follow-up instructions: Please follow-up with the urologist listed for further evaluation of your testicle pain and cyst..   Return instructions:   Please return to the Emergency Department if you experience worsening symptoms.   Return with worsening pain, swelling, difficulty with urination.  Please return if you have any other emergent concerns.  Additional Information:  Your vital signs today were: BP 139/85    Pulse (!) 56    Temp 97.6 F (36.4 C) (Oral)    Resp 18    Ht 6' (1.829 m)    Wt 63.5 kg (140 lb)    SpO2 100%    BMI 18.99 kg/m  If your blood pressure (BP) was elevated above 135/85 this visit, please have this repeated by your doctor within one month. --------------

## 2018-10-19 ENCOUNTER — Other Ambulatory Visit: Payer: Self-pay

## 2018-10-19 ENCOUNTER — Encounter: Payer: Self-pay | Admitting: Emergency Medicine

## 2018-10-19 ENCOUNTER — Ambulatory Visit
Admission: EM | Admit: 2018-10-19 | Discharge: 2018-10-19 | Disposition: A | Discharge status: Home / Self Care | Payer: BLUE CROSS/BLUE SHIELD | Attending: Physician Assistant | Admitting: Physician Assistant

## 2018-10-19 DIAGNOSIS — K591 Functional diarrhea: Secondary | ICD-10-CM

## 2018-10-19 MED ORDER — DICYCLOMINE HCL 20 MG PO TABS: 20.0000 mg | ORAL_TABLET | Freq: Two times a day (BID) | ORAL | 0 refills | Status: DC

## 2018-10-19 NOTE — ED Triage Notes (Addendum)
Pt presents to [facility] for assessment of diarrhea x 1 week.  Denies seeing blood, denies fevers, denies nausea and vomiting.  Pt c/o some abdominal pain prior to having a BM, denies any at this time.

## 2018-10-19 NOTE — Discharge Instructions (Signed)
Bentyl for abdominal cramping. Keep hydrated, you urine should be clear to pale yellow in color. Bland diet, advance as tolerated. Monitor for any worsening of symptoms, nausea or vomiting not controlled by medication, worsening abdominal pain, fever, follow-up for reevaluation.

## 2018-10-19 NOTE — ED Provider Notes (Signed)
[name]    CSN: [id number] Arrival date & time: 10/19/18  1526     History   Chief Complaint Chief Complaint  Patient presents with  . Diarrhea    HPI Chad Li is a 28 y.o. male.   28 year old male comes in for 1 week history of diarrhea. He has periumbilical pain that is cramping in sensation right before a BM, and resolves after BM. He has 1 episode a day of looser stool. Denies watery diarrhea, melena, hematochezia. Denies nausea, vomiting. Denies fever, chills, night sweats. Denies URI symptoms such as cough, congestion, sore throat. Still eating and drinking without problems.      Past Medical History:  Diagnosis Date  . Spinal stenosis     There are no active problems to display for this patient.   Past Surgical History:  Procedure Laterality Date  . NECK SURGERY         Home Medications    Prior to Admission medications   Medication Sig Start Date End Date Taking? Authorizing Provider  dicyclomine (BENTYL) 20 MG tablet Take 1 tablet (20 mg total) by mouth 2 (two) times daily. 10/19/18   Ok Edwards, PA-C    Family History History reviewed. No pertinent family history.  Social History Social History   Tobacco Use  . Smoking status: Never Smoker  . Smokeless tobacco: Never Used  Substance Use Topics  . Alcohol use: Yes    Comment: occ  . Drug use: No     Allergies   Patient has no known allergies.   Review of Systems Review of Systems  Reason unable to perform ROS: See HPI as above.     Physical Exam Triage Vital Signs ED Triage Vitals  Enc Vitals Group     BP 10/19/18 1534 121/78     Pulse Rate 10/19/18 1534 (!) 58     Resp 10/19/18 1534 16     Temp 10/19/18 1534 98 F (36.7 C)     Temp Source 10/19/18 1534 Oral     SpO2 10/19/18 1534 98 %     Weight --      Height --      Head Circumference --      Peak Flow --      Pain Score 10/19/18 1536 0     Pain Loc --      Pain Edu? --      Excl. in [place]? --     No data found.  Updated Vital Signs BP 121/78 (BP Location: Right Arm)   Pulse (!) 58   Temp 98 F (36.7 C) (Oral)   Resp 16   SpO2 98%   Visual Acuity Right Eye Distance:   Left Eye Distance:   Bilateral Distance:    Right Eye Near:   Left Eye Near:    Bilateral Near:     Physical Exam Constitutional:      General: He is not in acute distress.    Appearance: He is well-developed.  HENT:     Head: Normocephalic and atraumatic.  Cardiovascular:     Rate and Rhythm: Normal rate and regular rhythm.     Heart sounds: Normal heart sounds. No murmur. No friction rub. No gallop.   Pulmonary:     Effort: Pulmonary effort is normal.     Breath sounds: Normal breath sounds. No wheezing or rales.  Abdominal:     General: Bowel sounds are normal.     Palpations:  Abdomen is soft.     Tenderness: There is no abdominal tenderness. There is no right CVA tenderness, left CVA tenderness, guarding or rebound.  Skin:    General: Skin is warm and dry.  Neurological:     Mental Status: He is alert and oriented to person, place, and time.  Psychiatric:        Behavior: Behavior normal.        Judgment: Judgment normal.      UC Treatments / Results  Labs (all labs ordered are listed, but only abnormal results are displayed) Labs Reviewed - No data to display  EKG None  Radiology No results found.  Procedures Procedures (including critical care time)  Medications Ordered in UC Medications - No data to display  Initial Impression / Assessment and Plan / UC Course  I have reviewed the triage vital signs and the nursing notes.  Pertinent labs & imaging results that were available during my care of the patient were reviewed by me and considered in my medical decision making (see chart for details).    Will provide bentyl for abdominal cramping. Push fluids. Bland diet, advance as tolerated. Return precautions given. Patient express understanding and agrees to plan.  Final  Clinical Impressions(s) / UC Diagnoses   Final diagnoses:  Functional diarrhea    ED Prescriptions    Medication Sig Dispense Auth. Provider   dicyclomine (BENTYL) 20 MG tablet Take 1 tablet (20 mg total) by mouth 2 (two) times daily. 20 tablet Threasa AlphaYu, Starling Jessie V, PA-C        Laurance Heide V, [place] 10/19/18 1556

## 2018-10-19 NOTE — ED Notes (Signed)
Patient able to ambulate independently  

## 2018-11-22 ENCOUNTER — Other Ambulatory Visit: Payer: Self-pay

## 2018-11-22 ENCOUNTER — Ambulatory Visit
Admission: EM | Admit: 2018-11-22 | Discharge: 2018-11-22 | Disposition: A | Discharge status: Home / Self Care | Payer: Self-pay

## 2018-11-22 DIAGNOSIS — H6123 Impacted cerumen, bilateral: Secondary | ICD-10-CM

## 2018-11-22 NOTE — Discharge Instructions (Signed)
Can continue hydrogen peroxide to help with ear wax.

## 2018-11-22 NOTE — ED Triage Notes (Signed)
Pt c/o bilateral ear fullness for past few weeks, requesting for both ears to be flushed

## 2018-11-22 NOTE — ED Provider Notes (Signed)
[name]    CSN: [id number] Arrival date & time: 11/22/18  1448     History   Chief Complaint Chief Complaint  Patient presents with  . Ear Fullness    HPI Chad Li is a 28 y.o. male.   28 year old male comes in for increasingly worsened ear fullness for the past few weeks.  Denies changes in hearing, ear pain, ear drainage.  Denies URI symptoms such as cough, congestion, sore throat.  Denies fever, chills, night sweats.  He has been trying to use a wet rag to clean the ear without any relief.     Past Medical History:  Diagnosis Date  . Spinal stenosis     There are no active problems to display for this patient.   Past Surgical History:  Procedure Laterality Date  . NECK SURGERY         Home Medications    Prior to Admission medications   Medication Sig Start Date End Date Taking? Authorizing Provider  dicyclomine (BENTYL) 20 MG tablet Take 1 tablet (20 mg total) by mouth 2 (two) times daily. 10/19/18 11/22/18  Ok Edwards, PA-C    Family History History reviewed. No pertinent family history.  Social History Social History   Tobacco Use  . Smoking status: Never Smoker  . Smokeless tobacco: Never Used  Substance Use Topics  . Alcohol use: Yes    Comment: occ  . Drug use: No     Allergies   Patient has no known allergies.   Review of Systems Review of Systems  Reason unable to perform ROS: See HPI as above.     Physical Exam Triage Vital Signs ED Triage Vitals  Enc Vitals Group     BP 11/22/18 1501 128/85     Pulse Rate 11/22/18 1501 (!) 53     Resp 11/22/18 1501 16     Temp 11/22/18 1501 98.4 F (36.9 C)     Temp Source 11/22/18 1501 Oral     SpO2 11/22/18 1501 98 %     Weight --      Height --      Head Circumference --      Peak Flow --      Pain Score 11/22/18 1502 0     Pain Loc --      Pain Edu? --      Excl. in [place]? --    No data found.  Updated Vital Signs BP 128/85 (BP Location: Right Arm)    Pulse (!) 53   Temp 98.4 F (36.9 C) (Oral)   Resp 16   SpO2 98%   Physical Exam Constitutional:      General: He is not in acute distress.    Appearance: He is well-developed. He is not diaphoretic.  HENT:     Head: Normocephalic and atraumatic.     Ears:     Comments: Cerumen impaction, TM not visible bilaterally.  Post irrigation: TM visible bilaterally without erythema or bulging. Eyes:     Conjunctiva/sclera: Conjunctivae normal.     Pupils: Pupils are equal, round, and reactive to light.  Neurological:     Mental Status: He is alert and oriented to person, place, and time.      UC Treatments / Results  Labs (all labs ordered are listed, but only abnormal results are displayed) Labs Reviewed - No data to display  EKG   Radiology No results found.  Procedures Procedures (including critical care time)  Medications  Ordered in UC Medications - No data to display  Initial Impression / Assessment and Plan / UC Course  I have reviewed the triage vital signs and the nursing notes.  Pertinent labs & imaging results that were available during my care of the patient were reviewed by me and considered in my medical decision making (see chart for details).    Patient with relief after ear irrigation.  Discussed to continue hydrogen peroxide intermittently to help prevent cerumen impaction.  Recheck as needed.  Final Clinical Impressions(s) / UC Diagnoses   Final diagnoses:  Bilateral impacted cerumen   ED Prescriptions    None        Belinda FisherYu,  V, PA-C 11/22/18 1906

## 2019-03-29 ENCOUNTER — Encounter: Payer: Self-pay | Admitting: Emergency Medicine

## 2019-03-29 ENCOUNTER — Ambulatory Visit
Admission: EM | Admit: 2019-03-29 | Discharge: 2019-03-29 | Disposition: A | Discharge status: Home / Self Care | Payer: Self-pay

## 2019-03-29 ENCOUNTER — Other Ambulatory Visit: Payer: Self-pay

## 2019-03-29 DIAGNOSIS — Z0289 Encounter for other administrative examinations: Secondary | ICD-10-CM

## 2019-03-29 DIAGNOSIS — Z711 Person with feared health complaint in whom no diagnosis is made: Secondary | ICD-10-CM

## 2019-03-29 NOTE — ED Provider Notes (Signed)
[name]    CSN: [id number] Arrival date & time: 03/29/19  1243      History   Chief Complaint Chief Complaint  Patient presents with  . Abdominal Pain    HPI Chad Li is a 28 y.o. male presenting for 2-day course of periumbilical abdominal pain.  Denies nausea, vomiting, diarrhea, constipation, hematochezia, painful bowel movements.  Patient states that he left work Monday, has been feeling better since Tuesday afternoon.  States his employer is requesting note to be able to return to work.  No known Covid contact, fever, cough, shortness of breath.  No history of abdominal surgery.   Past Medical History:  Diagnosis Date  . Spinal stenosis     There are no active problems to display for this patient.   Past Surgical History:  Procedure Laterality Date  . NECK SURGERY         Home Medications    Prior to Admission medications   Medication Sig Start Date End Date Taking? Authorizing Provider  dicyclomine (BENTYL) 20 MG tablet Take 1 tablet (20 mg total) by mouth 2 (two) times daily. 10/19/18 11/22/18  Ok Edwards, PA-C    Family History Family History  Problem Relation Age of Onset  . Healthy Mother   . Healthy Father     Social History Social History   Tobacco Use  . Smoking status: Never Smoker  . Smokeless tobacco: Never Used  Substance Use Topics  . Alcohol use: Yes    Comment: occ  . Drug use: No     Allergies   Patient has no known allergies.   Review of Systems Review of Systems  Constitutional: Negative for fatigue and fever.  Respiratory: Negative for cough and shortness of breath.   Cardiovascular: Negative for chest pain and palpitations.  Gastrointestinal: Positive for abdominal pain. Negative for abdominal distention, anal bleeding, blood in stool, constipation, diarrhea, nausea, rectal pain and vomiting.       Resolved  Genitourinary: Negative for frequency, hematuria and urgency.  Musculoskeletal: Negative  for arthralgias and myalgias.  Skin: Negative for rash and wound.  Neurological: Negative for speech difficulty and headaches.  All other systems reviewed and are negative.    Physical Exam Triage Vital Signs ED Triage Vitals  Enc Vitals Group     BP      Pulse      Resp      Temp      Temp src      SpO2      Weight      Height      Head Circumference      Peak Flow      Pain Score      Pain Loc      Pain Edu?      Excl. in [place]?    No data found.  Updated Vital Signs BP 105/62 (BP Location: Left Arm)   Pulse 65   Temp 98.2 F (36.8 C) (Temporal)   Resp 16   SpO2 99%   Visual Acuity Right Eye Distance:   Left Eye Distance:   Bilateral Distance:    Right Eye Near:   Left Eye Near:    Bilateral Near:     Physical Exam Constitutional:      General: He is not in acute distress. HENT:     Head: Normocephalic and atraumatic.  Eyes:     General: No scleral icterus.    Pupils: Pupils are equal, round,  and reactive to light.  Cardiovascular:     Rate and Rhythm: Normal rate.  Pulmonary:     Effort: Pulmonary effort is normal.  Abdominal:     General: Bowel sounds are normal.     Palpations: Abdomen is soft. There is no hepatomegaly, splenomegaly or pulsatile mass.     Tenderness: There is abdominal tenderness. There is no guarding or rebound. Negative signs include Murphy's sign, Rovsing's sign and McBurney's sign.  Skin:    Coloration: Skin is not jaundiced or pale.  Neurological:     Mental Status: He is alert and oriented to person, place, and time.      UC Treatments / Results  Labs (all labs ordered are listed, but only abnormal results are displayed) Labs Reviewed - No data to display  EKG   Radiology No results found.  Procedures Procedures (including critical care time)  Medications Ordered in UC Medications - No data to display  Initial Impression / Assessment and Plan / UC Course  I have reviewed the triage vital signs and the  nursing notes.  Pertinent labs & imaging results that were available during my care of the patient were reviewed by me and considered in my medical decision making (see chart for details).     Patient reporting less than 48-hour course of periumbilical abdominal discomfort.  Appears to be self-limited, patient well in office today.  Work note provided.  Covid testing deferred.  Return precautions discussed, patient verbalized understanding and is agreeable to plan. Final Clinical Impressions(s) / UC Diagnoses   Final diagnoses:  Worried well     Discharge Instructions     Go to ER for worsening abdominal pain, specifically in your right lower quadrant, nausea, vomiting, blood in stool.    ED Prescriptions    None     PDMP not reviewed this encounter.   Hall-Potvin, [place], [place] 03/29/19 1315

## 2019-03-29 NOTE — ED Notes (Signed)
Patient able to ambulate independently  

## 2019-03-29 NOTE — ED Triage Notes (Signed)
Pt presents to [facility] for assessment of 2 days of mid abdominal pain.  Denies nausea, denies vomiting.  Pt denies diarrhea or constipation.  Denies changes to color to bowel movement.

## 2019-03-29 NOTE — Discharge Instructions (Addendum)
Go to ER for worsening abdominal pain, specifically in your right lower quadrant, nausea, vomiting, blood in stool.

## 2020-01-24 ENCOUNTER — Other Ambulatory Visit: Payer: Self-pay

## 2020-01-24 ENCOUNTER — Emergency Department (HOSPITAL_BASED_OUTPATIENT_CLINIC_OR_DEPARTMENT_OTHER)
Admission: EM | Admit: 2020-01-24 | Discharge: 2020-01-24 | Disposition: A | Discharge status: Home / Self Care | Payer: Self-pay | Attending: Emergency Medicine | Admitting: Emergency Medicine

## 2020-01-24 ENCOUNTER — Encounter (HOSPITAL_BASED_OUTPATIENT_CLINIC_OR_DEPARTMENT_OTHER): Payer: Self-pay | Admitting: Emergency Medicine

## 2020-01-24 ENCOUNTER — Emergency Department (HOSPITAL_BASED_OUTPATIENT_CLINIC_OR_DEPARTMENT_OTHER): Payer: Self-pay

## 2020-01-24 DIAGNOSIS — S161XXA Strain of muscle, fascia and tendon at neck level, initial encounter: Secondary | ICD-10-CM | POA: Insufficient documentation

## 2020-01-24 DIAGNOSIS — Y92410 Unspecified street and highway as the place of occurrence of the external cause: Secondary | ICD-10-CM | POA: Insufficient documentation

## 2020-01-24 MED ORDER — IBUPROFEN 400 MG PO TABS
600.0000 mg | ORAL_TABLET | Freq: Once | ORAL | Status: AC
  Administered 2020-01-24: 600 mg via ORAL

## 2020-01-24 MED ORDER — IBUPROFEN 200 MG PO TABS
ORAL_TABLET | ORAL | Status: AC
  Filled 2020-01-24: qty 3

## 2020-01-24 NOTE — ED Notes (Signed)
AVS reviewed with pt, discussed pain management as outlined by EDP, Soft Cervical Collar applied, work note also provided
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
## 2020-01-24 NOTE — Discharge Instructions (Signed)
Use ibuprofen or Tylenol for symptomatic care.  Return here as needed if you have any worsening symptoms.

## 2020-11-07 ENCOUNTER — Other Ambulatory Visit: Payer: Self-pay

## 2020-11-07 ENCOUNTER — Ambulatory Visit
Admission: EM | Admit: 2020-11-07 | Discharge: 2020-11-07 | Disposition: A | Discharge status: Home / Self Care | Payer: Self-pay | Attending: Physician Assistant | Admitting: Physician Assistant

## 2020-11-07 DIAGNOSIS — R1031 Right lower quadrant pain: Secondary | ICD-10-CM

## 2020-11-07 LAB — POCT URINALYSIS DIP (MANUAL ENTRY)
Bilirubin, UA: NEGATIVE
Blood, UA: NEGATIVE
Glucose, UA: NEGATIVE mg/dL
Ketones, POC UA: NEGATIVE mg/dL
Leukocytes, UA: NEGATIVE
Nitrite, UA: NEGATIVE
Protein Ur, POC: NEGATIVE mg/dL
Spec Grav, UA: >=1.03 — AB (ref 1.010–1.025)
Urobilinogen, UA: 0.2 E.U./dL
pH, UA: 6 (ref 5.0–8.0)

## 2020-11-07 NOTE — ED Triage Notes (Signed)
Two day h/o right sided "sharp" abdominal pain that has worsened since the onset with nasal congestion and cough. Pt notes decreased appetite. Denies n/v/d and constipation. No hematuria and dysuria. No meds taken.

## 2020-11-07 NOTE — Discharge Instructions (Addendum)
Unfortunately, I cannot rule out that there is something going on in your abdomen given you are tender on exam.  I do recommend that you go to the emergency room for further evaluation as we discussed.

## 2020-11-07 NOTE — ED Provider Notes (Signed)
Chad Li    CSN: [id number] Arrival date & time: 11/07/20  1509      History   Chief Complaint Chief Complaint  Patient presents with   Abdominal Pain   Cough    HPI Chad Li is a 30 y.o. male.   Patient presents today with a 2-day history of right-sided abdominal pain.  He reports symptoms began suddenly without identifiable trigger.  He reports pain waxes and wanes in intensity but is present at all times.  At its lowest is rated at 3 but increases to 7/8, localized to right lower quadrant without radiation, described as sharp/aching, no aggravating or alleviating factors identified.  He denies any fever, nausea, vomiting, changes in bowel habits, constipation, diarrhea, melena, hematochezia.  He has not tried any over-the-counter medication for symptom management.  He does report over the last 24 hours he has had worsening symptoms including nasal congestion and cough and is unsure if this could be related or something different.  He denies any previous abdominal surgeries.  Denies any recent medication change, antibiotic use, recent travel.   Past Medical History:  Diagnosis Date   Spinal stenosis     There are no problems to display for this patient.   Past Surgical History:  Procedure Laterality Date   NECK SURGERY         Home Medications    Prior to Admission medications   Medication Sig Start Date End Date Taking? Authorizing Provider  dicyclomine (BENTYL) 20 MG tablet Take 1 tablet (20 mg total) by mouth 2 (two) times daily. 10/19/18 11/22/18  Belinda Fisher, PA-C    Family History Family History  Problem Relation Age of Onset   Healthy Mother    Healthy Father     Social History Social History   Tobacco Use   Smoking status: Never   Smokeless tobacco: Never  Substance Use Topics   Alcohol use: Yes    Comment: occ   Drug use: No     Allergies   Patient has no known allergies.   Review of Systems Review of Systems   Constitutional:  Positive for activity change and appetite change. Negative for fatigue and fever.  HENT:  Positive for congestion. Negative for sinus pressure, sneezing and sore throat.   Respiratory:  Negative for cough and shortness of breath.   Cardiovascular:  Negative for chest pain.  Gastrointestinal:  Positive for abdominal pain. Negative for diarrhea, nausea and vomiting.  Musculoskeletal:  Negative for arthralgias and myalgias.  Neurological:  Negative for dizziness, light-headedness and headaches.    Physical Exam Triage Vital Signs ED Triage Vitals  Enc Vitals Group     BP 11/07/20 1531 119/72     Pulse Rate 11/07/20 1531 75     Resp 11/07/20 1531 18     Temp 11/07/20 1531 98.4 F (36.9 C)     Temp Source 11/07/20 1531 Oral     SpO2 11/07/20 1531 97 %     Weight --      Height --      Head Circumference --      Peak Flow --      Pain Score 11/07/20 1535 7     Pain Loc --      Pain Edu? --      Excl. in GC? --    No data found.  Updated Vital Signs BP 119/72 (BP Location: Left Arm)   Pulse 75   Temp 98.4 F (36.9 C) (  Oral)   Resp 18   SpO2 97%   Visual Acuity Right Eye Distance:   Left Eye Distance:   Bilateral Distance:    Right Eye Near:   Left Eye Near:    Bilateral Near:     Physical Exam Vitals reviewed.  Constitutional:      General: He is awake.     Appearance: Normal appearance. He is normal weight. He is not ill-appearing.     Comments: Very pleasant male appears stated age in no acute distress sitting comfortably in exam room  HENT:     Head: Normocephalic and atraumatic.     Right Ear: Tympanic membrane, ear canal and external ear normal. Tympanic membrane is not erythematous or bulging.     Left Ear: Tympanic membrane, ear canal and external ear normal. Tympanic membrane is not erythematous or bulging.     Nose: Nose normal.     Mouth/Throat:     Pharynx: Uvula midline. No oropharyngeal exudate or posterior oropharyngeal erythema.   Cardiovascular:     Rate and Rhythm: Normal rate and regular rhythm.     Heart sounds: Normal heart sounds, S1 normal and S2 normal. No murmur heard. Pulmonary:     Effort: Pulmonary effort is normal.     Breath sounds: Normal breath sounds. No stridor. No wheezing, rhonchi or rales.     Comments: Clear to auscultation bilaterally Abdominal:     General: Bowel sounds are normal.     Palpations: Abdomen is soft.     Tenderness: There is abdominal tenderness in the right lower quadrant. There is no right CVA tenderness, left CVA tenderness, guarding or rebound. Negative signs include Rovsing's sign, psoas sign and obturator sign.     Comments: Mild tenderness to palpation in right lower quadrant.  Negative Rovsing sign, psoas sign, obturator sign  Neurological:     Mental Status: He is alert.  Psychiatric:        Behavior: Behavior is cooperative.     UC Treatments / Results  Labs (all labs ordered are listed, but only abnormal results are displayed) Labs Reviewed  POCT URINALYSIS DIP (MANUAL ENTRY) - Abnormal; Notable for the following components:      Result Value   Spec Grav, UA >=1.030 (*)    All other components within normal limits    EKG   Radiology No results found.  Procedures Procedures (including critical Li time)  Medications Ordered in UC Medications - No data to display  Initial Impression / Assessment and Plan / UC Course  I have reviewed the triage vital signs and the nursing notes.  Pertinent labs & imaging results that were available during my Li of the patient were reviewed by me and considered in my medical decision making (see chart for details).      Vital signs and physical exam reassuring today.  UA was obtained which showed high specific gravity but was otherwise normal.  Patient had mild tenderness palpation in right lower quadrant we discussed that we are unable to rule out intra-abdominal problem.  Unfortunately, we are unable to obtain  imaging in outpatient setting through urgent Li so patient would need to go to the emergency room.  Patient is agreeable and will go to the ER for further evaluation of abdominal pain.  Discussed alarm symptoms that would mean he needs to stop and call 911 including fever, severe abdominal pain, nausea, vomiting, lightheadedness, chest pain, shortness of breath.  Patient stable at time of discharge for  private transport and will go directly to emergency room following visit today.  Final Clinical Impressions(s) / UC Diagnoses   Final diagnoses:  RLQ abdominal pain     Discharge Instructions      Unfortunately, I cannot rule out that there is something going on in your abdomen given you are tender on exam.  I do recommend that you go to the emergency room for further evaluation as we discussed.     ED Prescriptions   None    PDMP not reviewed this encounter.   Jeani Hawking, PA-C 11/07/20 1621
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
## 2021-06-18 ENCOUNTER — Ambulatory Visit
Admission: EM | Admit: 2021-06-18 | Discharge: 2021-06-18 | Disposition: A | Discharge status: Home / Self Care | Payer: BC Managed Care – PPO | Attending: Internal Medicine | Admitting: Internal Medicine

## 2021-06-18 ENCOUNTER — Other Ambulatory Visit: Payer: Self-pay

## 2021-06-18 DIAGNOSIS — H6123 Impacted cerumen, bilateral: Secondary | ICD-10-CM

## 2021-06-18 NOTE — ED Provider Notes (Signed)
[name]    CSN: [id number] Arrival date & time: 06/18/21  1228      History   Chief Complaint Chief Complaint  Patient presents with   Ear Fullness    left    HPI Chad Li is a 31 y.o. male.   Patient presents with bilateral ear fullness that has been present for the past few days.  Tried over-the-counter remedies with no improvement.  Denies ear pain, decreased hearing, trauma, foreign bodies, decreased hearing.  Denies any known fevers or upper respiratory symptoms.   Ear Fullness   Past Medical History:  Diagnosis Date   Spinal stenosis     There are no problems to display for this patient.   Past Surgical History:  Procedure Laterality Date   NECK SURGERY         Home Medications    Prior to Admission medications   Medication Sig Start Date End Date Taking? Authorizing Provider  dicyclomine (BENTYL) 20 MG tablet Take 1 tablet (20 mg total) by mouth 2 (two) times daily. 10/19/18 11/22/18  Belinda Fisher, PA-C    Family History Family History  Problem Relation Age of Onset   Healthy Mother    Healthy Father     Social History Social History   Tobacco Use   Smoking status: Never   Smokeless tobacco: Never  Substance Use Topics   Alcohol use: Yes    Comment: occ   Drug use: No     Allergies   Patient has no known allergies.   Review of Systems Review of Systems Per HPI  Physical Exam Triage Vital Signs ED Triage Vitals [06/18/21 1250]  Enc Vitals Group     BP 134/69     Pulse Rate 63     Resp 18     Temp 98 F (36.7 C)     Temp Source Oral     SpO2 98 %     Weight      Height      Head Circumference      Peak Flow      Pain Score 0     Pain Loc      Pain Edu?      Excl. in GC?    No data found.  Updated Vital Signs BP 134/69 (BP Location: Left Arm)    Pulse 63    Temp 98 F (36.7 C) (Oral)    Resp 18    SpO2 98%   Visual Acuity Right Eye Distance:   Left Eye Distance:   Bilateral Distance:     Right Eye Near:   Left Eye Near:    Bilateral Near:     Physical Exam Constitutional:      General: He is not in acute distress.    Appearance: Normal appearance. He is not toxic-appearing or diaphoretic.  HENT:     Head: Normocephalic and atraumatic.     Right Ear: Ear canal and external ear normal. Tympanic membrane is not perforated, erythematous or bulging.     Left Ear: Ear canal and external ear normal. Tympanic membrane is not perforated, erythematous or bulging.     Ears:     Comments: Bilateral cerumen impaction on original exam.  Ears were irrigated bilaterally with successful removal of cerumen.  Tympanic membranes normal.  External canal normal. Eyes:     Extraocular Movements: Extraocular movements intact.     Conjunctiva/sclera: Conjunctivae normal.  Pulmonary:     Effort: Pulmonary effort  is normal.  Neurological:     General: No focal deficit present.     Mental Status: He is alert and oriented to person, place, and time. Mental status is at baseline.  Psychiatric:        Mood and Affect: Mood normal.        Behavior: Behavior normal.        Thought Content: Thought content normal.        Judgment: Judgment normal.     UC Treatments / Results  Labs (all labs ordered are listed, but only abnormal results are displayed) Labs Reviewed - No data to display  EKG   Radiology No results found.  Procedures Procedures (including critical care time)  Medications Ordered in UC Medications - No data to display  Initial Impression / Assessment and Plan / UC Course  I have reviewed the triage vital signs and the nursing notes.  Pertinent labs & imaging results that were available during my care of the patient were reviewed by me and considered in my medical decision making (see chart for details).     Ear irrigation completed bilaterally with successful removal of cerumen.  No signs of infection.  Discussed strict return precautions.  Patient verbalized  understanding and was agreeable with plan. Final Clinical Impressions(s) / UC Diagnoses   Final diagnoses:  Bilateral impacted cerumen     Discharge Instructions      Your ears have been cleaned out.  Please follow-up if symptoms persist or worsen.     ED Prescriptions   None    PDMP not reviewed this encounter.   Gustavus Bryant, [place] 06/18/21 1312

## 2021-06-18 NOTE — Discharge Instructions (Signed)
Your ears have been cleaned out.  Please follow-up if symptoms persist or worsen. ?

## 2021-06-18 NOTE — ED Triage Notes (Signed)
A few days of left ear fullness. Pt has tried cleaning his ears without resolvement of sxs. No right ear sxs. Denies otalgia.

## 2021-08-25 ENCOUNTER — Ambulatory Visit
Admission: EM | Admit: 2021-08-25 | Discharge: 2021-08-25 | Disposition: A | Discharge status: Home / Self Care | Payer: BC Managed Care – PPO | Attending: Urgent Care | Admitting: Urgent Care

## 2021-08-25 DIAGNOSIS — Z7251 High risk heterosexual behavior: Secondary | ICD-10-CM | POA: Diagnosis present

## 2021-08-25 NOTE — ED Provider Notes (Signed)
?  [facility] ? ? ?MRN: [id number] DOB: 05-30-1990 ? ?Subjective:  ? ?Chad Li is a 31 y.o. male presenting for STI screening. Has sex without condom use, has 1 male partner. There have been 2 episodes of vaginal pain with sex. Denies dysuria, hematuria, urinary frequency, penile discharge, penile swelling, testicular pain, testicular swelling, anal pain, groin pain. ? ?No current facility-administered medications for this encounter. ?No current outpatient medications on file.  ? ?No Known Allergies ? ?Past Medical History:  ?Diagnosis Date  ? Spinal stenosis   ?  ? ?Past Surgical History:  ?Procedure Laterality Date  ? NECK SURGERY    ? ? ?Family History  ?Problem Relation Age of Onset  ? Healthy Mother   ? Healthy Father   ? ? ?Social History  ? ?Tobacco Use  ? Smoking status: Never  ? Smokeless tobacco: Never  ?Substance Use Topics  ? Alcohol use: Yes  ?  Comment: occ  ? Drug use: No  ? ? ?ROS ? ? ?Objective:  ? ?Vitals: ?BP 130/77 (BP Location: Left Arm)   Pulse 68   Temp 97.8 ?F (36.6 ?C) (Oral)   Resp 18   SpO2 98%  ? ?Physical Exam ?Constitutional:   ?   General: He is not in acute distress. ?   Appearance: Normal appearance. He is well-developed and normal weight. He is not ill-appearing, toxic-appearing or diaphoretic.  ?HENT:  ?   Head: Normocephalic and atraumatic.  ?   Right Ear: External ear normal.  ?   Left Ear: External ear normal.  ?   Nose: Nose normal.  ?   Mouth/Throat:  ?   Pharynx: Oropharynx is clear.  ?Eyes:  ?   General: No scleral icterus.    ?   Right eye: No discharge.     ?   Left eye: No discharge.  ?   Extraocular Movements: Extraocular movements intact.  ?Cardiovascular:  ?   Rate and Rhythm: Normal rate.  ?Pulmonary:  ?   Effort: Pulmonary effort is normal.  ?Musculoskeletal:  ?   Cervical back: Normal range of motion.  ?Neurological:  ?   Mental Status: He is alert and oriented to person, place, and time.  ?Psychiatric:     ?   Mood and Affect: Mood normal.      ?   Behavior: Behavior normal.     ?   Thought Content: Thought content normal.     ?   Judgment: Judgment normal.  ? ? ?Assessment and Plan :  ? ?PDMP not reviewed this encounter. ? ?1. Unprotected sex   ? ?STI screen pending. Will treat based off of results. Patient did a self collect swab prior to the start of my visit with him.  ?  ?Wallis Bamberg, PA-C ?08/25/21 1339 ? ?

## 2021-08-25 NOTE — ED Triage Notes (Signed)
Pt presents today for STI screening. He denies sxs or known exposures. ?

## 2021-08-26 LAB — RPR: RPR Ser Ql: NONREACTIVE

## 2021-08-26 LAB — CYTOLOGY, (ORAL, ANAL, URETHRAL) ANCILLARY ONLY
Chlamydia: NEGATIVE
Comment: NEGATIVE
Comment: NEGATIVE
Comment: NORMAL
Neisseria Gonorrhea: NEGATIVE
Trichomonas: NEGATIVE

## 2021-08-26 LAB — HIV ANTIBODY (ROUTINE TESTING W REFLEX): HIV Screen 4th Generation wRfx: NONREACTIVE

## 2021-10-23 ENCOUNTER — Ambulatory Visit
Admission: EM | Admit: 2021-10-23 | Discharge: 2021-10-23 | Disposition: A | Discharge status: Home / Self Care | Payer: BC Managed Care – PPO | Attending: Internal Medicine | Admitting: Internal Medicine

## 2021-10-23 DIAGNOSIS — Z113 Encounter for screening for infections with a predominantly sexual mode of transmission: Secondary | ICD-10-CM | POA: Insufficient documentation

## 2021-10-23 NOTE — Discharge Instructions (Addendum)
Your STD tests are pending.  We will call if they are abnormal and treat as appropriate if necessary.  Please refrain from sexual activity until test results and treatment are complete.

## 2021-10-23 NOTE — ED Provider Notes (Signed)
[name]    CSN: [id number] Arrival date & time: 10/23/21  1428      History   Chief Complaint Chief Complaint  Patient presents with   sti check    HPI Loki LEONDRO CORYELL is a 31 y.o. male.   Patient presents for routine STD testing.  Denies any new sexual partners, exposure to STD, associated symptoms.  Denies testing for HIV or syphilis.     Past Medical History:  Diagnosis Date   Spinal stenosis     There are no problems to display for this patient.   Past Surgical History:  Procedure Laterality Date   NECK SURGERY         Home Medications    Prior to Admission medications   Medication Sig Start Date End Date Taking? Authorizing Provider  dicyclomine (BENTYL) 20 MG tablet Take 1 tablet (20 mg total) by mouth 2 (two) times daily. 10/19/18 11/22/18  Belinda Fisher, PA-C    Family History Family History  Problem Relation Age of Onset   Healthy Mother    Healthy Father     Social History Social History   Tobacco Use   Smoking status: Never   Smokeless tobacco: Never  Substance Use Topics   Alcohol use: Yes    Comment: occ   Drug use: No     Allergies   Patient has no known allergies.   Review of Systems Review of Systems Per HPI  Physical Exam Triage Vital Signs ED Triage Vitals [10/23/21 1442]  Enc Vitals Group     BP 125/70     Pulse Rate 65     Resp 18     Temp 98.4 F (36.9 C)     Temp Source Oral     SpO2 99 %     Weight      Height      Head Circumference      Peak Flow      Pain Score 0     Pain Loc      Pain Edu?      Excl. in GC?    No data found.  Updated Vital Signs BP 125/70 (BP Location: Right Arm)   Pulse 65   Temp 98.4 F (36.9 C) (Oral)   Resp 18   SpO2 99%   Visual Acuity Right Eye Distance:   Left Eye Distance:   Bilateral Distance:    Right Eye Near:   Left Eye Near:    Bilateral Near:     Physical Exam Constitutional:      General: He is not in acute distress.    Appearance:  Normal appearance. He is not toxic-appearing or diaphoretic.  HENT:     Head: Normocephalic and atraumatic.  Eyes:     Extraocular Movements: Extraocular movements intact.     Conjunctiva/sclera: Conjunctivae normal.  Pulmonary:     Effort: Pulmonary effort is normal.  Genitourinary:    Comments: Deferred with shared decision making. Self swab performed.  Neurological:     General: No focal deficit present.     Mental Status: He is alert and oriented to person, place, and time. Mental status is at baseline.  Psychiatric:        Mood and Affect: Mood normal.        Behavior: Behavior normal.        Thought Content: Thought content normal.        Judgment: Judgment normal.      UC Treatments /  Results  Labs (all labs ordered are listed, but only abnormal results are displayed) Labs Reviewed  CYTOLOGY, (ORAL, ANAL, URETHRAL) ANCILLARY ONLY    EKG   Radiology No results found.  Procedures Procedures (including critical care time)  Medications Ordered in UC Medications - No data to display  Initial Impression / Assessment and Plan / UC Course  I have reviewed the triage vital signs and the nursing notes.  Pertinent labs & imaging results that were available during my care of the patient were reviewed by me and considered in my medical decision making (see chart for details).     Routine STD testing pending with cytology swab.  Patient declined HIV and syphilis testing.  Patient advised to refrain from sexual activity until test results and treatment are complete.  Patient verbalized understanding and was agreeable with plan. Final Clinical Impressions(s) / UC Diagnoses   Final diagnoses:  Screening examination for venereal disease     Discharge Instructions      Your STD tests are pending.  We will call if they are abnormal and treat as appropriate if necessary.  Please refrain from sexual activity until test results and treatment are complete.    ED  Prescriptions   None    PDMP not reviewed this encounter.   Gustavus Bryant, [place] 10/23/21 1504
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
## 2021-10-24 LAB — CYTOLOGY, (ORAL, ANAL, URETHRAL) ANCILLARY ONLY
Chlamydia: NEGATIVE
Comment: NEGATIVE
Comment: NEGATIVE
Comment: NORMAL
Neisseria Gonorrhea: NEGATIVE
Trichomonas: NEGATIVE
